# Patient Record
Sex: Male | Born: 1995 | Hispanic: Yes | Marital: Single | Smoking: Current some day smoker
Health system: Southern US, Community
[De-identification: ages and names within clinical notes are randomized; demographics above are authoritative.]

---

## 2017-05-24 ENCOUNTER — Encounter (HOSPITAL_COMMUNITY): Payer: Self-pay | Admitting: Emergency Medicine

## 2017-05-24 ENCOUNTER — Emergency Department (HOSPITAL_COMMUNITY)
Admission: EM | Admit: 2017-05-24 | Discharge: 2017-05-24 | Disposition: A | Discharge status: Home / Self Care | Payer: No Typology Code available for payment source | Attending: Emergency Medicine | Admitting: Emergency Medicine

## 2017-05-24 ENCOUNTER — Emergency Department (HOSPITAL_COMMUNITY): Payer: No Typology Code available for payment source

## 2017-05-24 DIAGNOSIS — S82891A Other fracture of right lower leg, initial encounter for closed fracture: Secondary | ICD-10-CM

## 2017-05-24 DIAGNOSIS — Y92410 Unspecified street and highway as the place of occurrence of the external cause: Secondary | ICD-10-CM | POA: Diagnosis not present

## 2017-05-24 DIAGNOSIS — Y999 Unspecified external cause status: Secondary | ICD-10-CM | POA: Diagnosis not present

## 2017-05-24 DIAGNOSIS — Z23 Encounter for immunization: Secondary | ICD-10-CM | POA: Insufficient documentation

## 2017-05-24 DIAGNOSIS — Y9389 Activity, other specified: Secondary | ICD-10-CM | POA: Diagnosis not present

## 2017-05-24 DIAGNOSIS — S80811A Abrasion, right lower leg, initial encounter: Secondary | ICD-10-CM | POA: Insufficient documentation

## 2017-05-24 DIAGNOSIS — S90911A Unspecified superficial injury of right ankle, initial encounter: Secondary | ICD-10-CM | POA: Diagnosis not present

## 2017-05-24 DIAGNOSIS — M25571 Pain in right ankle and joints of right foot: Secondary | ICD-10-CM

## 2017-05-24 DIAGNOSIS — S8264XA Nondisplaced fracture of lateral malleolus of right fibula, initial encounter for closed fracture: Secondary | ICD-10-CM | POA: Insufficient documentation

## 2017-05-24 DIAGNOSIS — R4182 Altered mental status, unspecified: Secondary | ICD-10-CM | POA: Diagnosis present

## 2017-05-24 LAB — URINALYSIS, ROUTINE W REFLEX MICROSCOPIC
BILIRUBIN URINE: NEGATIVE
Bacteria, UA: NONE SEEN
GLUCOSE, UA: 150 mg/dL — AB
Ketones, ur: NEGATIVE mg/dL
Leukocytes, UA: NEGATIVE
NITRITE: NEGATIVE
PROTEIN: NEGATIVE mg/dL
Specific Gravity, Urine: 1.008 (ref 1.005–1.030)
pH: 6 (ref 5.0–8.0)

## 2017-05-24 LAB — CBC
HEMATOCRIT: 47.1 % (ref 39.0–52.0)
HEMOGLOBIN: 16.7 g/dL (ref 13.0–17.0)
MCH: 32.2 pg (ref 26.0–34.0)
MCHC: 35.5 g/dL (ref 30.0–36.0)
MCV: 90.8 fL (ref 78.0–100.0)
Platelets: 284 10*3/uL (ref 150–400)
RBC: 5.19 MIL/uL (ref 4.22–5.81)
RDW: 12.8 % (ref 11.5–15.5)
WBC: 15 10*3/uL — AB (ref 4.0–10.5)

## 2017-05-24 LAB — I-STAT CG4 LACTIC ACID, ED: LACTIC ACID, VENOUS: 2.75 mmol/L — AB (ref 0.5–1.9)

## 2017-05-24 LAB — COMPREHENSIVE METABOLIC PANEL
ALBUMIN: 4.3 g/dL (ref 3.5–5.0)
ALK PHOS: 89 U/L (ref 38–126)
ALT: 39 U/L (ref 17–63)
ANION GAP: 13 (ref 5–15)
AST: 41 U/L (ref 15–41)
BILIRUBIN TOTAL: 0.6 mg/dL (ref 0.3–1.2)
BUN: 6 mg/dL (ref 6–20)
CALCIUM: 8.9 mg/dL (ref 8.9–10.3)
CO2: 23 mmol/L (ref 22–32)
Chloride: 104 mmol/L (ref 101–111)
Creatinine, Ser: 0.71 mg/dL (ref 0.61–1.24)
GLUCOSE: 121 mg/dL — AB (ref 65–99)
POTASSIUM: 3.4 mmol/L — AB (ref 3.5–5.1)
Sodium: 140 mmol/L (ref 135–145)
TOTAL PROTEIN: 6.6 g/dL (ref 6.5–8.1)

## 2017-05-24 LAB — PROTIME-INR
INR: 0.94
Prothrombin Time: 12.5 seconds (ref 11.4–15.2)

## 2017-05-24 LAB — I-STAT CHEM 8, ED
BUN: 7 mg/dL (ref 6–20)
Calcium, Ion: 1.02 mmol/L — ABNORMAL LOW (ref 1.15–1.40)
Chloride: 101 mmol/L (ref 101–111)
Creatinine, Ser: 0.9 mg/dL (ref 0.61–1.24)
Glucose, Bld: 119 mg/dL — ABNORMAL HIGH (ref 65–99)
HCT: 49 % (ref 39.0–52.0)
Hemoglobin: 16.7 g/dL (ref 13.0–17.0)
Potassium: 3.4 mmol/L — ABNORMAL LOW (ref 3.5–5.1)
SODIUM: 142 mmol/L (ref 135–145)
TCO2: 25 mmol/L (ref 22–32)

## 2017-05-24 LAB — SAMPLE TO BLOOD BANK

## 2017-05-24 LAB — ETHANOL: ALCOHOL ETHYL (B): 149 mg/dL — AB (ref <10)

## 2017-05-24 MED ORDER — SODIUM CHLORIDE 0.9 % IV SOLN
INTRAVENOUS | Status: DC
  Administered 2017-05-24: 1000 mL via INTRAVENOUS

## 2017-05-24 MED ORDER — SODIUM CHLORIDE 0.9 % IV BOLUS (SEPSIS)
1000.0000 mL | Freq: Once | INTRAVENOUS | Status: AC
  Administered 2017-05-24: 1000 mL via INTRAVENOUS

## 2017-05-24 MED ORDER — HYDROCODONE-ACETAMINOPHEN 5-325 MG PO TABS: 1.0000 | ORAL_TABLET | Freq: Four times a day (QID) | ORAL | 0 refills | Status: DC | PRN

## 2017-05-24 MED ORDER — IBUPROFEN 400 MG PO TABS: 400.0000 mg | ORAL_TABLET | Freq: Three times a day (TID) | ORAL | 0 refills | Status: AC

## 2017-05-24 MED ORDER — IOPAMIDOL (ISOVUE-300) INJECTION 61%
INTRAVENOUS | Status: AC
  Administered 2017-05-24: 100 mL
  Filled 2017-05-24: qty 100

## 2017-05-24 MED ORDER — TETANUS-DIPHTH-ACELL PERTUSSIS 5-2.5-18.5 LF-MCG/0.5 IM SUSP
0.5000 mL | Freq: Once | INTRAMUSCULAR | Status: AC
  Administered 2017-05-24: 0.5 mL via INTRAMUSCULAR
  Filled 2017-05-24: qty 0.5

## 2017-05-24 NOTE — ED Notes (Signed)
D/c reviewed with patient utilizing stratus interpreter. No further questions at this time
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
## 2017-05-24 NOTE — ED Notes (Signed)
Returned from xray

## 2017-05-24 NOTE — ED Notes (Signed)
Pt limping,  Having difficulty bearing weight on right leg. Used interpretor phone for translation.

## 2017-05-24 NOTE — Progress Notes (Signed)
Orthopedic Tech Progress Note Patient Details:  Karl Rodriguez June 04, 1995 [id number]  Ortho Devices Type of Ortho Device: Crutches, CAM walker Ortho Device/Splint Interventions: Application   Post Interventions Patient Tolerated: Well Instructions Provided: Care of device   Saul FordyceJennifer C Alegra Rost 05/24/2017, 11:46 AM

## 2017-05-25 LAB — CDS SEROLOGY
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
## 2022-11-01 ENCOUNTER — Ambulatory Visit (HOSPITAL_COMMUNITY)
Admission: EM | Admit: 2022-11-01 | Discharge: 2022-11-01 | Disposition: A | Discharge status: Home / Self Care | Payer: Self-pay | Attending: Internal Medicine | Admitting: Internal Medicine

## 2022-11-01 ENCOUNTER — Other Ambulatory Visit: Payer: Self-pay

## 2022-11-01 ENCOUNTER — Encounter (HOSPITAL_COMMUNITY): Payer: Self-pay | Admitting: Emergency Medicine

## 2022-11-01 DIAGNOSIS — B354 Tinea corporis: Secondary | ICD-10-CM

## 2022-11-01 DIAGNOSIS — S161XXA Strain of muscle, fascia and tendon at neck level, initial encounter: Secondary | ICD-10-CM

## 2022-11-01 MED ORDER — METHOCARBAMOL 500 MG PO TABS: 500.0000 mg | ORAL_TABLET | Freq: Every evening | ORAL | 0 refills | Status: AC | PRN

## 2022-11-01 MED ORDER — IBUPROFEN 600 MG PO TABS: 600.0000 mg | ORAL_TABLET | Freq: Four times a day (QID) | ORAL | 0 refills | Status: AC | PRN

## 2022-11-01 MED ORDER — TERBINAFINE HCL 250 MG PO TABS: 250.0000 mg | ORAL_TABLET | Freq: Every day | ORAL | 0 refills | Status: AC
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
## 2022-11-01 NOTE — Discharge Instructions (Addendum)
Gentle range of motion exercises Please take medications as recommended Please do not drive or operate heavy machinery after taking muscle relaxants because the muscle relaxants make you drowsy If you have persistent headaches, blurry vision or double vision, nausea or persistent vomiting please return to urgent care to be evaluated.
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
